# Patient Record
Sex: Male | Born: 1955 | Hispanic: No | Marital: Single | Smoking: Former smoker
Health system: Southern US, Community
[De-identification: ages and names within clinical notes are randomized; demographics above are authoritative.]

## PROBLEM LIST (undated history)

## (undated) DIAGNOSIS — G4700 Insomnia, unspecified: Secondary | ICD-10-CM

## (undated) DIAGNOSIS — E291 Testicular hypofunction: Secondary | ICD-10-CM

## (undated) DIAGNOSIS — N529 Male erectile dysfunction, unspecified: Secondary | ICD-10-CM

## (undated) DIAGNOSIS — K2270 Barrett's esophagus without dysplasia: Secondary | ICD-10-CM

## (undated) DIAGNOSIS — K029 Dental caries, unspecified: Secondary | ICD-10-CM

## (undated) DIAGNOSIS — B1920 Unspecified viral hepatitis C without hepatic coma: Secondary | ICD-10-CM

## (undated) DIAGNOSIS — E119 Type 2 diabetes mellitus without complications: Secondary | ICD-10-CM

## (undated) HISTORY — DX: Male erectile dysfunction, unspecified: N52.9

## (undated) HISTORY — DX: Testicular hypofunction: E29.1

## (undated) HISTORY — PX: WRIST SURGERY: SHX841

## (undated) HISTORY — DX: Insomnia, unspecified: G47.00

## (undated) HISTORY — DX: Dental caries, unspecified: K02.9

## (undated) HISTORY — DX: Barrett's esophagus without dysplasia: K22.70

## (undated) HISTORY — DX: Unspecified viral hepatitis C without hepatic coma: B19.20

## (undated) HISTORY — DX: Type 2 diabetes mellitus without complications: E11.9

---

## 2011-03-27 DIAGNOSIS — E109 Type 1 diabetes mellitus without complications: Secondary | ICD-10-CM | POA: Insufficient documentation

## 2012-05-04 DIAGNOSIS — K2270 Barrett's esophagus without dysplasia: Secondary | ICD-10-CM | POA: Insufficient documentation

## 2012-05-04 DIAGNOSIS — K222 Esophageal obstruction: Secondary | ICD-10-CM | POA: Insufficient documentation

## 2013-06-28 DIAGNOSIS — S63096A Other dislocation of unspecified wrist and hand, initial encounter: Secondary | ICD-10-CM | POA: Insufficient documentation

## 2013-07-30 DIAGNOSIS — M009 Pyogenic arthritis, unspecified: Secondary | ICD-10-CM | POA: Insufficient documentation

## 2014-03-31 ENCOUNTER — Ambulatory Visit: Payer: Self-pay | Admitting: Gastroenterology

## 2014-06-24 ENCOUNTER — Telehealth: Payer: Self-pay

## 2014-06-24 NOTE — Telephone Encounter (Signed)
Pt needs a letter from Dr. Servando SnareWohl stating he needs to take his Hep C medication daily without stopping. GF  > From: Samuella Cotallyn, Shaunna > To: Anasofia Micallef > Sent: 06/23/2014 10:17 AM > please call pt

## 2014-08-08 ENCOUNTER — Telehealth: Payer: Self-pay | Admitting: Gastroenterology

## 2014-08-08 NOTE — Telephone Encounter (Signed)
Pt needs to talk about his treatment

## 2014-08-08 NOTE — Telephone Encounter (Signed)
Pt has completed his HEP C treatment. Advised him he needs follow up appt. Pt scheduled with Dr. Servando SnareWohl on 08-22-14.

## 2014-08-19 ENCOUNTER — Other Ambulatory Visit: Payer: Self-pay

## 2014-08-22 ENCOUNTER — Other Ambulatory Visit: Payer: Self-pay

## 2014-08-22 ENCOUNTER — Encounter (INDEPENDENT_AMBULATORY_CARE_PROVIDER_SITE_OTHER): Payer: Self-pay

## 2014-08-22 ENCOUNTER — Ambulatory Visit (INDEPENDENT_AMBULATORY_CARE_PROVIDER_SITE_OTHER): Payer: BLUE CROSS/BLUE SHIELD | Admitting: Gastroenterology

## 2014-08-22 ENCOUNTER — Encounter: Payer: Self-pay | Admitting: Gastroenterology

## 2014-08-22 VITALS — BP 139/74 | HR 76 | Temp 98.2°F | Ht 78.0 in | Wt 239.0 lb

## 2014-08-22 DIAGNOSIS — E291 Testicular hypofunction: Secondary | ICD-10-CM | POA: Insufficient documentation

## 2014-08-22 DIAGNOSIS — B182 Chronic viral hepatitis C: Secondary | ICD-10-CM | POA: Diagnosis not present

## 2014-08-22 DIAGNOSIS — N529 Male erectile dysfunction, unspecified: Secondary | ICD-10-CM | POA: Insufficient documentation

## 2014-08-22 DIAGNOSIS — G4700 Insomnia, unspecified: Secondary | ICD-10-CM | POA: Insufficient documentation

## 2014-08-22 DIAGNOSIS — E1165 Type 2 diabetes mellitus with hyperglycemia: Secondary | ICD-10-CM | POA: Insufficient documentation

## 2014-08-22 DIAGNOSIS — K029 Dental caries, unspecified: Secondary | ICD-10-CM | POA: Insufficient documentation

## 2014-08-22 DIAGNOSIS — N400 Enlarged prostate without lower urinary tract symptoms: Secondary | ICD-10-CM | POA: Insufficient documentation

## 2014-08-22 DIAGNOSIS — IMO0002 Reserved for concepts with insufficient information to code with codable children: Secondary | ICD-10-CM | POA: Insufficient documentation

## 2014-08-22 NOTE — Progress Notes (Signed)
Primary Care Physician: Manfred Arch, MD  Primary Gastroenterologist:  Dr. Midge Minium  Chief Complaint  Patient presents with  . Hepatitis C    Treatment completed    HPI: Richard Molina is a 59 y.o. male here for follow-up of hepatitis C. The patient has a history of peptic stones with a history of surgery of the pancreas. The patient also was found to have hepatitis C and was recently treated. The patient's imaging showed the patient to have cirrhosis of the liver. The patient has been diabetic due to his pancreatic problems and takes enzymes for his pancreatic insufficiency.  Current Outpatient Prescriptions  Medication Sig Dispense Refill  . ANDROGEL PUMP 20.25 MG/ACT (1.62%) GEL APPLY FOUR PUMPS ONCE DAILY (IN THE MORNING)  5  . buPROPion (WELLBUTRIN SR) 150 MG 12 hr tablet Take by mouth.    . cyclobenzaprine (FLEXERIL) 10 MG tablet TAKE ONE TABLET THREE TIMES DAILY if needed  2  . diphenhydrAMINE (BENADRYL) 50 MG capsule Take 50 mg by mouth.    Marland Kitchen NOVOLOG 100 UNIT/ML injection USE AS DIRECTED VIA INSULIN PUMP (100 UNITS PER DAY)  11  . NOVOLOG FLEXPEN 100 UNIT/ML FlexPen Use up to 50 units/day, divided TID AC meals as per MD instructions  12  . ZENPEP 25000 UNITS CPEP TAKE ONE CAPSULE THREE TIMES DAILY  1  . zolpidem (AMBIEN) 10 MG tablet Take ONE (1) TABLET BY MOUTH AT BEDTIME AS NEEDED  1  . gabapentin (NEURONTIN) 100 MG capsule Take 100 mg by mouth.    . MULTIPLE VITAMINS-MINERALS ER PO Take by mouth.    . sildenafil (REVATIO) 20 MG tablet Take by mouth.     No current facility-administered medications for this visit.    Allergies as of 08/22/2014  . (No Known Allergies)    ROS:  General: Negative for anorexia, weight loss, fever, chills, fatigue, weakness. ENT: Negative for hoarseness, difficulty swallowing , nasal congestion. CV: Negative for chest pain, angina, palpitations, dyspnea on exertion, peripheral edema.  Respiratory: Negative for dyspnea at rest, dyspnea  on exertion, cough, sputum, wheezing.  GI: See history of present illness. GU:  Negative for dysuria, hematuria, urinary incontinence, urinary frequency, nocturnal urination.  Endo: Negative for unusual weight change.    Physical Examination:   BP 139/74 mmHg  Pulse 76  Temp(Src) 98.2 F (36.8 C)  Ht 6\' 6"  (1.981 m)  Wt 239 lb (108.41 kg)  BMI 27.62 kg/m2  General: Well-nourished, well-developed in no acute distress.  Eyes: No icterus. Conjunctivae pink. Mouth: Oropharyngeal mucosa moist and pink , no lesions erythema or exudate. Lungs: Clear to auscultation bilaterally. Non-labored. Heart: Regular rate and rhythm, no murmurs rubs or gallops.  Abdomen: Bowel sounds are normal, nontender, nondistended, no hepatosplenomegaly or masses, no abdominal bruits or hernia , no rebound or guarding.   Extremities: No lower extremity edema. No clubbing or deformities. Neuro: Alert and oriented x 3.  Grossly intact. Skin: Warm and dry, no jaundice.   Psych: Alert and cooperative, normal mood and affect.  Labs:    Imaging Studies: No results found.  Assessment and Plan:   Richard Molina is a 59 y.o. y/o male with a history of hepatitis C who was just recently completed treatment. The patient states that he had fatigue and headaches during the treatment. He now is also reporting that he has to stay very aware of his activity due to becoming hypoglycemic very easily due to his pancreatic problem. The patient will have his labs checked  to see if his viral load is negative. He'll also be checked in 6 months. He has been told with his history of cirrhosis he needs to be every 6 months for hepatocellular carcinoma screening. He has been explained the plan and agrees with it.

## 2014-10-26 DIAGNOSIS — M25519 Pain in unspecified shoulder: Secondary | ICD-10-CM | POA: Insufficient documentation

## 2015-04-26 DIAGNOSIS — K7400 Hepatic fibrosis, unspecified: Secondary | ICD-10-CM | POA: Insufficient documentation

## 2015-08-29 DIAGNOSIS — E139 Other specified diabetes mellitus without complications: Secondary | ICD-10-CM | POA: Insufficient documentation

## 2015-08-29 DIAGNOSIS — Z90410 Acquired total absence of pancreas: Secondary | ICD-10-CM

## 2015-08-29 DIAGNOSIS — E891 Postprocedural hypoinsulinemia: Secondary | ICD-10-CM

## 2016-08-28 ENCOUNTER — Telehealth: Payer: Self-pay | Admitting: Gastroenterology

## 2016-08-28 NOTE — Telephone Encounter (Signed)
I set an appt for 8/21 Hep C follow up with Dr. Servando SnareWohl. Patient wants to go ahead and have an ultrasound done now if he can due to insurance. Please call

## 2016-08-29 ENCOUNTER — Other Ambulatory Visit: Payer: Self-pay

## 2016-08-29 DIAGNOSIS — Z8619 Personal history of other infectious and parasitic diseases: Secondary | ICD-10-CM

## 2016-08-29 NOTE — Telephone Encounter (Signed)
Pt scheduled for a RUQ abdominal US [facility] [place] on Tuesday, August 21st at 1:00. Pt has been instructed to arrive at 12:45am and to be npo 6 hours prior. Pt verbalized understanding of these instructions.

## 2016-09-10 ENCOUNTER — Ambulatory Visit
Admission: RE | Admit: 2016-09-10 | Discharge: 2016-09-10 | Disposition: A | Discharge status: Home / Self Care | Payer: BC Managed Care – PPO | Source: Ambulatory Visit | Attending: Gastroenterology | Admitting: Gastroenterology

## 2016-09-10 ENCOUNTER — Ambulatory Visit (INDEPENDENT_AMBULATORY_CARE_PROVIDER_SITE_OTHER): Payer: BC Managed Care – PPO | Admitting: Gastroenterology

## 2016-09-10 ENCOUNTER — Other Ambulatory Visit: Payer: Self-pay

## 2016-09-10 ENCOUNTER — Encounter: Payer: Self-pay | Admitting: Gastroenterology

## 2016-09-10 VITALS — BP 147/67 | HR 76 | Temp 98.2°F | Ht 78.0 in | Wt 235.0 lb

## 2016-09-10 DIAGNOSIS — Z8619 Personal history of other infectious and parasitic diseases: Secondary | ICD-10-CM | POA: Diagnosis present

## 2016-09-10 DIAGNOSIS — K7460 Unspecified cirrhosis of liver: Secondary | ICD-10-CM

## 2016-09-10 DIAGNOSIS — B182 Chronic viral hepatitis C: Secondary | ICD-10-CM

## 2016-09-10 NOTE — Progress Notes (Signed)
Primary Care Physician: Manfred Arch, MD  Primary Gastroenterologist:  Dr. Midge Minium  Chief Complaint  Patient presents with  . Follow up Hepatitis C    HPI: Richard Molina is a 61 y.o. male here for follow-up visit for his cirrhosis and hepatitis C.  The patient has been treated for his hepatitis C and has sustained viral response. The patient now comes in for follow-up of his cirrhosis.  The patient was followed at [facility] but his gastrologist there left.  Current Outpatient Prescriptions  Medication Sig Dispense Refill  . doxepin (SINEQUAN) 10 MG/ML solution TAKE 0.67ml-0.6ml BY MOUTH EVERY NIGHT AT BEDTIME  99  . FETZIMA 80 MG CP24 TAKE ONE (1) CAPSULE BY MOUTH ONCE DAILY  99  . insulin glargine (LANTUS) 100 UNIT/ML injection 30 unit subcutaneously at bedtime    . Insulin Pen Needle (BD PEN NEEDLE NANO U/F) 32G X 4 MM MISC as directed    . NOVOLOG 100 UNIT/ML injection USE AS DIRECTED VIA INSULIN PUMP (100 UNITS PER DAY)  11  . NOVOLOG FLEXPEN 100 UNIT/ML FlexPen Use up to 50 units/day, divided TID AC meals as per MD instructions  12  . testosterone cypionate (DEPOTESTOSTERONE CYPIONATE) 200 MG/ML injection Inject ONE (1) milliliter into the muscle every TWO weeks  2  . ZENPEP 25000 UNITS CPEP TAKE ONE CAPSULE THREE TIMES DAILY  1  . zolpidem (AMBIEN) 10 MG tablet Take ONE (1) TABLET BY MOUTH AT BEDTIME AS NEEDED  1  . ANDROGEL PUMP 20.25 MG/ACT (1.62%) GEL APPLY FOUR PUMPS ONCE DAILY (IN THE MORNING)  5  . buPROPion (WELLBUTRIN SR) 150 MG 12 hr tablet Take by mouth.    . cyclobenzaprine (FLEXERIL) 10 MG tablet TAKE ONE TABLET THREE TIMES DAILY if needed  2  . diphenhydrAMINE (BENADRYL) 50 MG capsule Take 50 mg by mouth.    . gabapentin (NEURONTIN) 100 MG capsule Take 100 mg by mouth.    . MULTIPLE VITAMINS-MINERALS ER PO Take by mouth.    . sildenafil (REVATIO) 20 MG tablet Take by mouth.     No current facility-administered medications for this visit.     Allergies as of  09/10/2016  . (No Known Allergies)    ROS:  General: Negative for anorexia, weight loss, fever, chills, fatigue, weakness. ENT: Negative for hoarseness, difficulty swallowing , nasal congestion. CV: Negative for chest pain, angina, palpitations, dyspnea on exertion, peripheral edema.  Respiratory: Negative for dyspnea at rest, dyspnea on exertion, cough, sputum, wheezing.  GI: See history of present illness. GU:  Negative for dysuria, hematuria, urinary incontinence, urinary frequency, nocturnal urination.  Endo: Negative for unusual weight change.    Physical Examination:   BP (!) 147/67   Pulse 76   Temp 98.2 F (36.8 C) (Oral)   Ht 6\' 6"  (1.981 m)   Wt 235 lb (106.6 kg)   BMI 27.16 kg/m   General: Well-nourished, well-developed in no acute distress.  Eyes: No icterus. Conjunctivae pink. Mouth: Oropharyngeal mucosa moist and pink , no lesions erythema or exudate. Lungs: Clear to auscultation bilaterally. Non-labored. Heart: Regular rate and rhythm, no murmurs rubs or gallops.  Abdomen: Bowel sounds are normal, nontender, nondistended, no hepatosplenomegaly or masses, no abdominal bruits or hernia , no rebound or guarding.   Extremities: No lower extremity edema. No clubbing or deformities. Neuro: Alert and oriented x 3.  Grossly intact. Skin: Warm and dry, no jaundice.   Psych: Alert and cooperative, normal mood and affect.  Labs:  Imaging Studies: US Abdomen Limited Ruq  Result Date: 09/10/2016 CLINICAL DATA:  History of hepatitis-C. EXAM: ULTRASOUND ABDOMEN LIMITED RIGHT UPPER QUADRANT COMPARISON:  None. FINDINGS: Gallbladder: No gallstones or wall thickening visualized. No sonographic Murphy sign noted by sonographer. Common bile duct: Diameter: 4.5 mm Liver: No focal mass identified. Portal vein is patent on color Doppler imaging with normal direction of blood flow towards the liver. IMPRESSION: 1. No focal liver mass.  CT and MRI are both more sensitive. 2. No other  abnormalities. Electronically Signed   By: Gerome Sam III M.D   On: 09/10/2016 14:26    Assessment and Plan:   Richard Molina is a 61 y.o. y/o male Who has a history of hepatitis C causing cirrhosis.  The patient has had sustained viral response after treatment for his hepatitis C.  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  The patient will be set up for a alpha-fetoprotein blood test.  The patient had an ultrasound this morning.  The patient will be informed of the results and will follow up in 6 months.    Midge Minium, MD. Clementeen Graham   Note: This dictation was prepared with Dragon dictation along with smaller phrase technology. Any transcriptional errors that result from this process are unintentional.

## 2016-09-16 ENCOUNTER — Telehealth: Payer: Self-pay

## 2016-09-16 NOTE — Telephone Encounter (Signed)
-----   Message from Midge Minium, MD sent at 09/10/2016  6:20 PM EDT ----- The patient know his ultrasound did not show any masses

## 2016-09-16 NOTE — Telephone Encounter (Signed)
Pt notified of [place] results. Asked about the AFP lab test. Pt will go and have this done soon.

## 2017-09-09 ENCOUNTER — Other Ambulatory Visit: Payer: Self-pay

## 2017-09-12 ENCOUNTER — Other Ambulatory Visit: Payer: Self-pay

## 2017-09-12 DIAGNOSIS — B182 Chronic viral hepatitis C: Secondary | ICD-10-CM

## 2017-09-17 ENCOUNTER — Ambulatory Visit
Admission: RE | Admit: 2017-09-17 | Discharge: 2017-09-17 | Disposition: A | Discharge status: Home / Self Care | Payer: BC Managed Care – PPO | Source: Ambulatory Visit | Attending: Gastroenterology | Admitting: Gastroenterology

## 2017-09-17 ENCOUNTER — Encounter (INDEPENDENT_AMBULATORY_CARE_PROVIDER_SITE_OTHER): Payer: Self-pay

## 2017-09-17 DIAGNOSIS — B182 Chronic viral hepatitis C: Secondary | ICD-10-CM

## 2017-09-23 ENCOUNTER — Ambulatory Visit: Payer: BC Managed Care – PPO | Admitting: Gastroenterology

## 2017-09-24 ENCOUNTER — Ambulatory Visit: Payer: Self-pay | Admitting: Gastroenterology

## 2017-09-24 ENCOUNTER — Encounter: Payer: Self-pay | Admitting: Gastroenterology

## 2017-09-24 ENCOUNTER — Other Ambulatory Visit: Payer: Self-pay

## 2017-09-24 VITALS — BP 140/83 | HR 65 | Ht 78.0 in | Wt 233.0 lb

## 2017-09-24 DIAGNOSIS — Z8619 Personal history of other infectious and parasitic diseases: Secondary | ICD-10-CM

## 2017-09-24 DIAGNOSIS — K219 Gastro-esophageal reflux disease without esophagitis: Secondary | ICD-10-CM

## 2017-09-24 DIAGNOSIS — R1310 Dysphagia, unspecified: Secondary | ICD-10-CM

## 2017-09-24 NOTE — Progress Notes (Signed)
Primary Care Physician: Manfred Arch, MD  Primary Gastroenterologist:  Dr. Midge Minium  Chief Complaint  Patient presents with  . Follow up Cirrhosis    HPI: Richard Molina is a 62 y.o. male here for follow-up of his hepatitis C and history of cirrhosis.  The patient has had multiple ultrasounds and alpha-fetoprotein in the past due to his history of cirrhosis.  The patient was treated for his hepatitis C in 2016 and has had a negative viral load with normal liver enzymes since then.  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  All of his ultrasounds have not shown any signs of portal hypertension or nodularity of the liver.  The patient has been doing well except that he states that he is now having some trouble swallowing.  He reports that he has had his esophagus stretched in the past when he has had this complaint before.  Current Outpatient Medications  Medication Sig Dispense Refill  . albuterol (VENTOLIN HFA) 108 (90 Base) MCG/ACT inhaler Inhale into the lungs.    . ANDROGEL PUMP 20.25 MG/ACT (1.62%) GEL APPLY FOUR PUMPS ONCE DAILY (IN THE MORNING)  5  . atorvastatin (LIPITOR) 10 MG tablet atorvastatin 10 mg tablet    . buPROPion (WELLBUTRIN SR) 150 MG 12 hr tablet Take by mouth.    . cyclobenzaprine (FLEXERIL) 10 MG tablet TAKE ONE TABLET THREE TIMES DAILY if needed  2  . diphenhydrAMINE (BENADRYL) 50 MG capsule Take 50 mg by mouth.    . doxepin (SINEQUAN) 10 MG/ML solution TAKE 0.59ml-0.6ml BY MOUTH EVERY NIGHT AT BEDTIME  99  . FETZIMA 80 MG CP24 TAKE ONE (1) CAPSULE BY MOUTH ONCE DAILY  99  . gabapentin (NEURONTIN) 100 MG capsule Take 100 mg by mouth.    Marland Kitchen ibuprofen (ADVIL,MOTRIN) 200 MG tablet Take by mouth.    . insulin glargine (LANTUS) 100 UNIT/ML injection 30 unit subcutaneously at bedtime    . Insulin Pen Needle (BD PEN NEEDLE NANO U/F) 32G X 4 MM MISC as directed    . MULTIPLE  VITAMINS-MINERALS ER PO Take by mouth.    Marland Kitchen NOVOLOG 100 UNIT/ML injection USE AS DIRECTED VIA INSULIN PUMP (100 UNITS PER DAY)  11  . NOVOLOG FLEXPEN 100 UNIT/ML FlexPen Use up to 50 units/day, divided TID AC meals as per MD instructions  12  . ondansetron (ZOFRAN) 8 MG tablet ondansetron HCl 8 mg tablet  TAKE ONE TABLET BY MOUTH EVERY 8 HOURS for TWO days    . oxyCODONE (ROXICODONE) 15 MG immediate release tablet oxycodone 15 mg tablet    . Oxycodone HCl 10 MG TABS oxycodone 10 mg tablet    . sildenafil (REVATIO) 20 MG tablet Take by mouth.    . tamsulosin (FLOMAX) 0.4 MG CAPS capsule Take 0.4 mg by mouth daily.  0  . testosterone cypionate (DEPOTESTOSTERONE CYPIONATE) 200 MG/ML injection Inject ONE (1) milliliter into the muscle every TWO weeks  2  . ZENPEP 25000 UNITS CPEP TAKE ONE CAPSULE THREE TIMES DAILY  1  . zolpidem (AMBIEN) 10 MG tablet Take ONE (1) TABLET BY MOUTH AT BEDTIME AS NEEDED  1   No current facility-administered medications for this visit.     Allergies as of 09/24/2017  . (No Known Allergies)    ROS:  General: Negative for anorexia, weight loss, fever, chills, fatigue, weakness. ENT: Negative for hoarseness, difficulty swallowing , nasal congestion. CV:  Negative for chest pain, angina, palpitations, dyspnea on exertion, peripheral edema.  Respiratory: Negative for dyspnea at rest, dyspnea on exertion, cough, sputum, wheezing.  GI: See history of present illness. GU:  Negative for dysuria, hematuria, urinary incontinence, urinary frequency, nocturnal urination.  Endo: Negative for unusual weight change.    Physical Examination:   BP 140/83   Pulse 65   Ht 6\' 6"  (1.981 m)   Wt 233 lb (105.7 kg)   BMI 26.93 kg/m   General: Well-nourished, well-developed in no acute distress.  Eyes: No icterus. Conjunctivae pink. Mouth: Oropharyngeal mucosa moist and pink , no lesions erythema or exudate. Lungs: Clear to auscultation bilaterally. Non-labored. Heart:  Regular rate and rhythm, no murmurs rubs or gallops.  Abdomen: Bowel sounds are normal, nontender, nondistended, no hepatosplenomegaly or masses, no abdominal bruits or hernia , no rebound or guarding.   Extremities: No lower extremity edema. No clubbing or deformities. Neuro: Alert and oriented x 3.  Grossly intact. Skin: Warm and dry, no jaundice.   Psych: Alert and cooperative, normal mood and affect.  Labs:    Imaging Studies: US Abdomen Limited Ruq  Result Date: 09/17/2017 CLINICAL DATA:  62 year old male with a history of prior Whipple and hepatitis C EXAM: ULTRASOUND ABDOMEN LIMITED RIGHT UPPER QUADRANT COMPARISON:  CT 06/25/2013, ultrasound 09/10/2016 FINDINGS: Gallbladder: No gallstones or wall thickening visualized. No sonographic Murphy sign noted by sonographer. Common bile duct: Diameter: 5 mm Liver: Coarsened echogenicity of liver parenchyma without significant nodularity of the liver surface. No focal lesion identified. Portal vein is patent on color Doppler imaging with normal direction of blood flow towards the liver. IMPRESSION: Hepatic ultrasound survey is negative for focal lesion, with relatively increased echogenicity potentially representing chronic medical liver disease. Electronically Signed   By: Gilmer Mor D.O.   On: 09/17/2017 15:22    Assessment and Plan:   Richard Molina is a 62 y.o. y/o male who has a history of esophageal strictures and hepatitis C who was treated for his hepatitis C and came to be with a pre-existing diagnosis of cirrhosis.  The patient has not had any signs of cirrhosis on his lab work or his ultrasound in the past.  The patient will be set up for a fibrosis scan to see if there is any sign of cirrhosis.  If there is no cirrhosis then the cell carcinoma surveillance will not need to be continued.  The patient will be set up for an EGD because of his dysphasia.  He will also have his alpha-fetoprotein sent off until we are sure that he does not have  cirrhosis.  The patient has been explained the plan and agrees with it.    Midge Minium, MD. Clementeen Graham   Note: This dictation was prepared with Dragon dictation along with smaller phrase technology. Any transcriptional errors that result from this process are unintentional.

## 2017-10-23 ENCOUNTER — Ambulatory Visit: Payer: BC Managed Care – PPO | Admitting: Gastroenterology

## 2017-10-23 ENCOUNTER — Encounter

## 2018-01-10 LAB — AFP TUMOR MARKER: AFP, Serum, Tumor Marker: 2.4 ng/mL (ref 0.0–8.3)

## 2018-01-16 ENCOUNTER — Telehealth: Payer: Self-pay

## 2018-01-16 NOTE — Telephone Encounter (Signed)
-----   Message from Midge Miniumarren Wohl, MD sent at 01/10/2018  7:04 AM EST ----- Let the patient know the tumor marker was negative.

## 2018-01-16 NOTE — Telephone Encounter (Signed)
Pt notified of results

## 2019-02-11 IMAGING — US US ABDOMEN LIMITED
1 series · 14 of 25 positions shown · non-contrast
Comparison: None.

CLINICAL DATA: History of hepatitis-C.

EXAM:
ULTRASOUND ABDOMEN LIMITED RIGHT UPPER QUADRANT

[Series 1: us abdomen limited · 0.26mm/px · 14 of 49 slices shown]
[im 1/49]
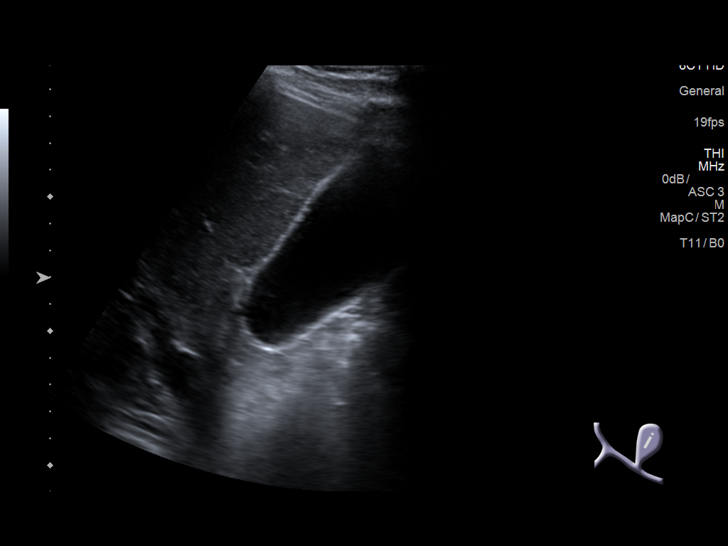
[im 5/49]
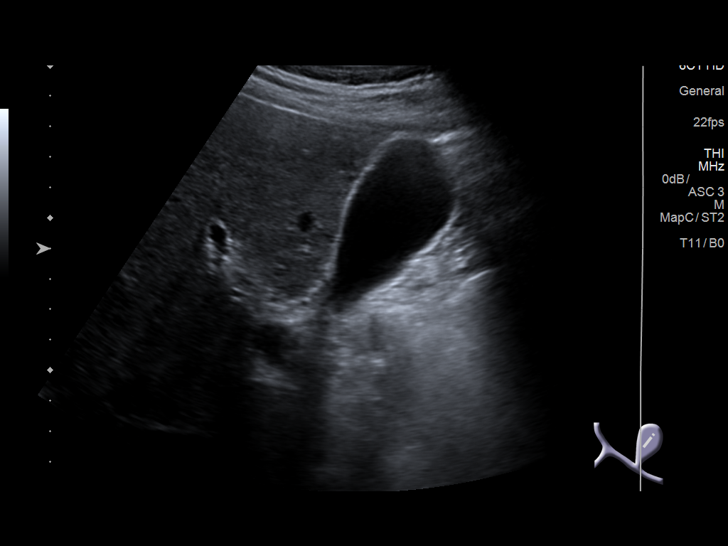
[im 9/49]
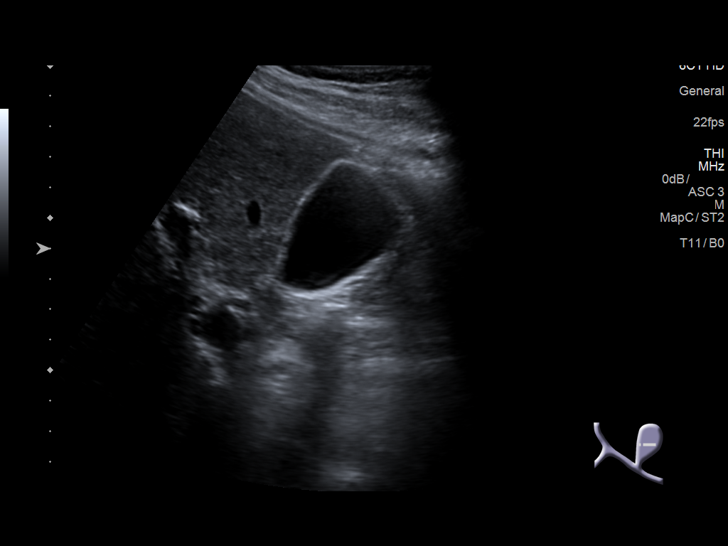
[im 13/49]
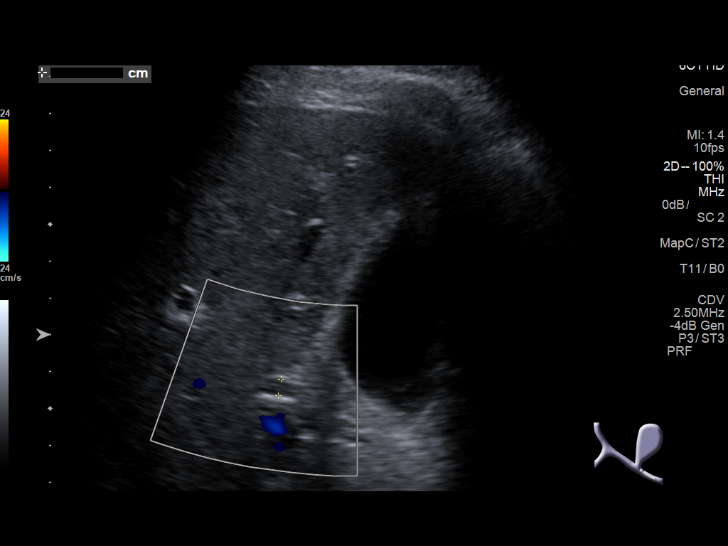
[im 17/49]
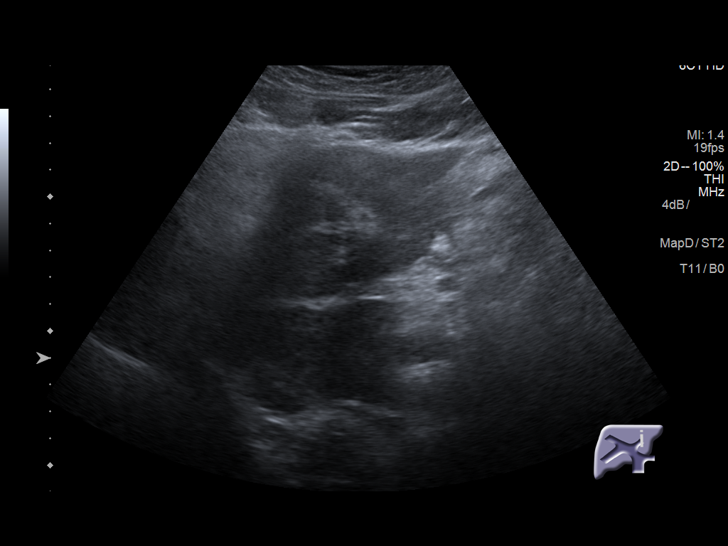
[im 19/49]
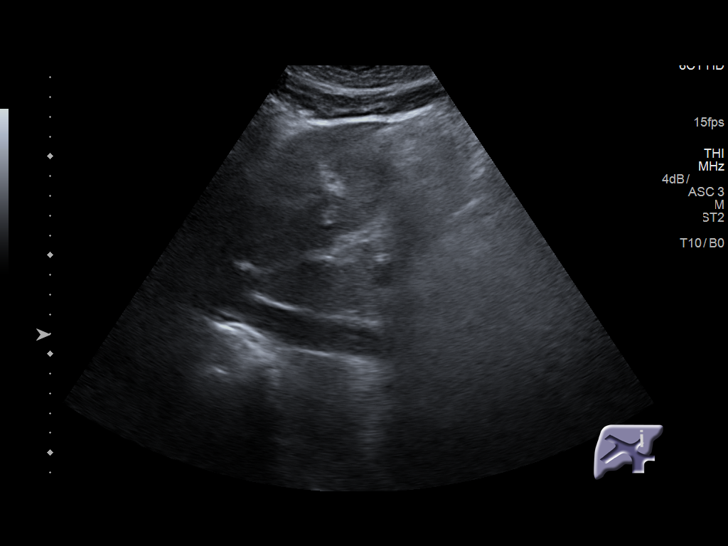
[im 23/49]
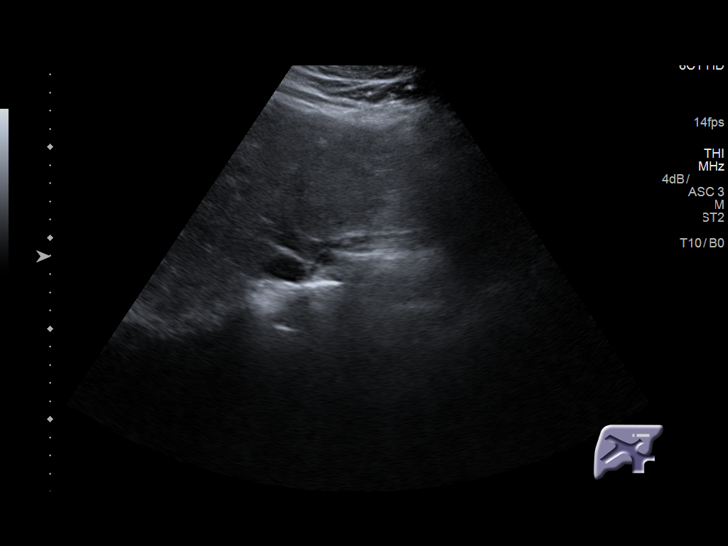
[im 27/49]
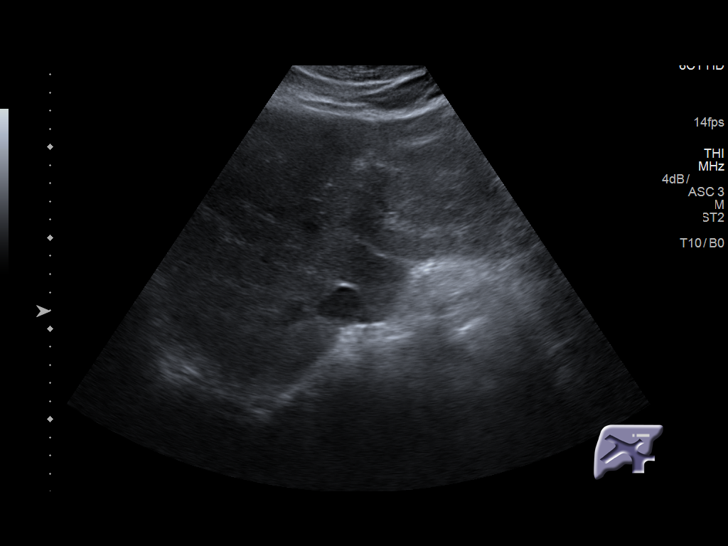
[im 31/49]
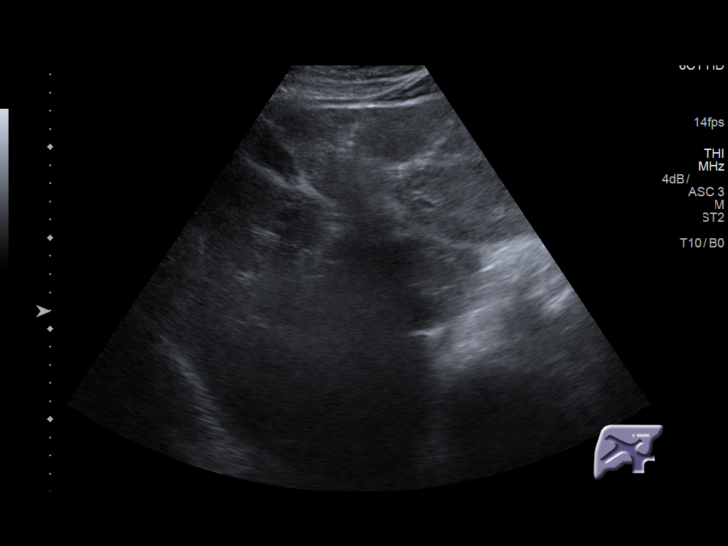
[im 33/49]
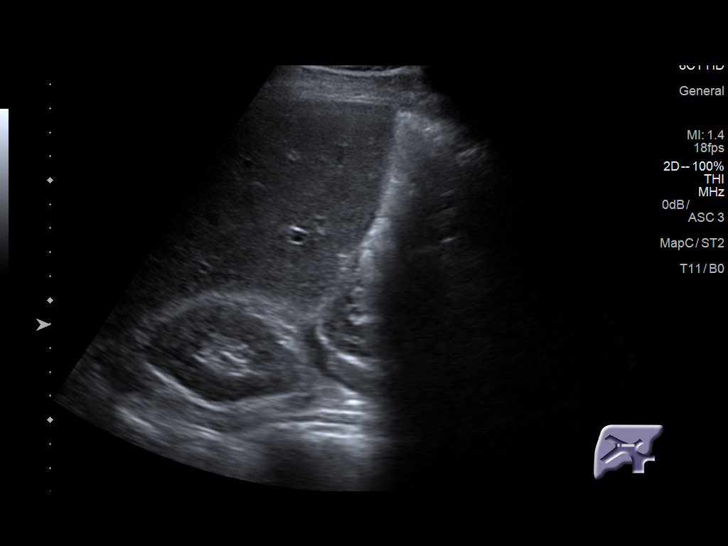
[im 37/49]
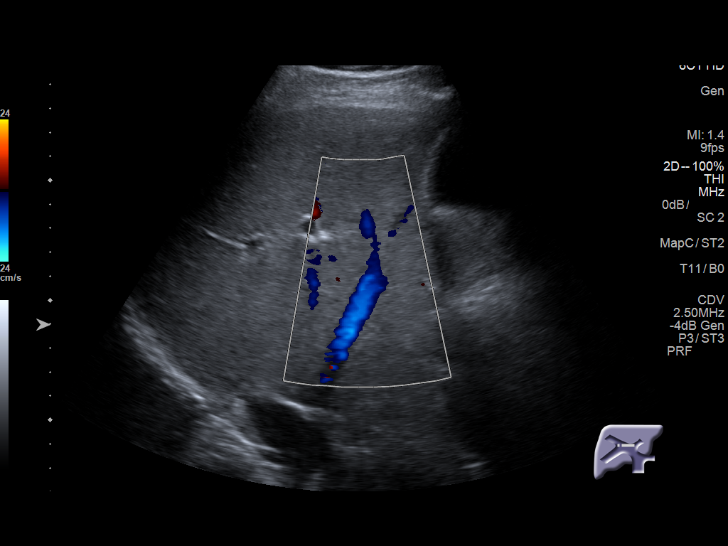
[im 41/49]
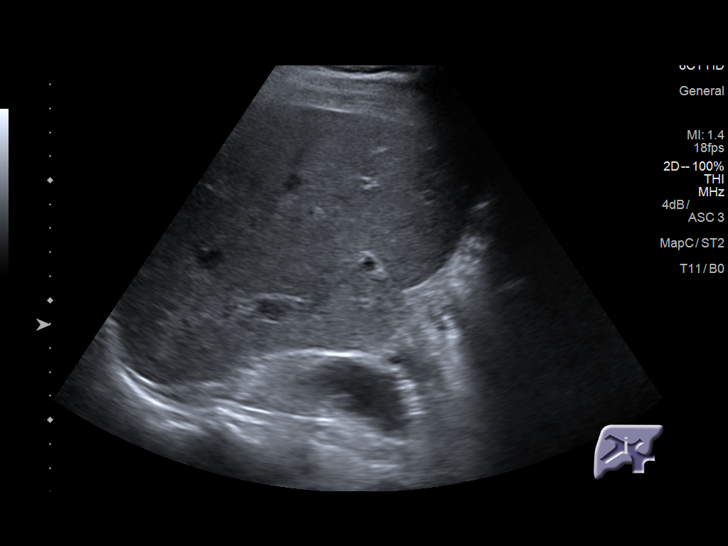
[im 45/49]
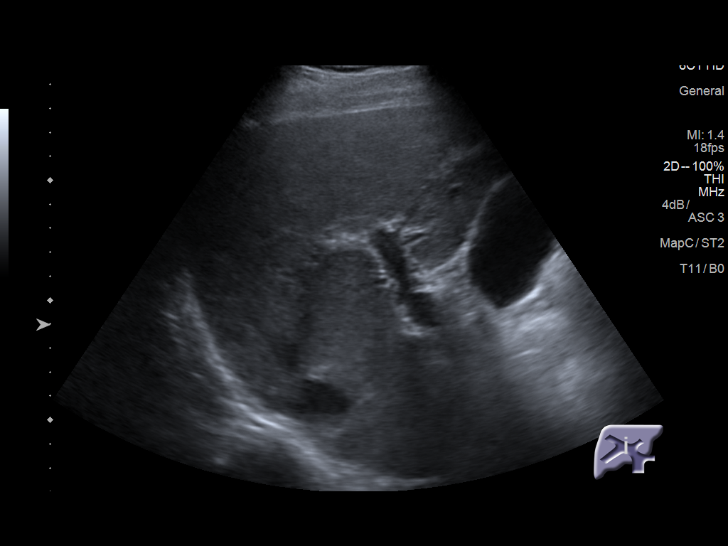
[im 49/49]
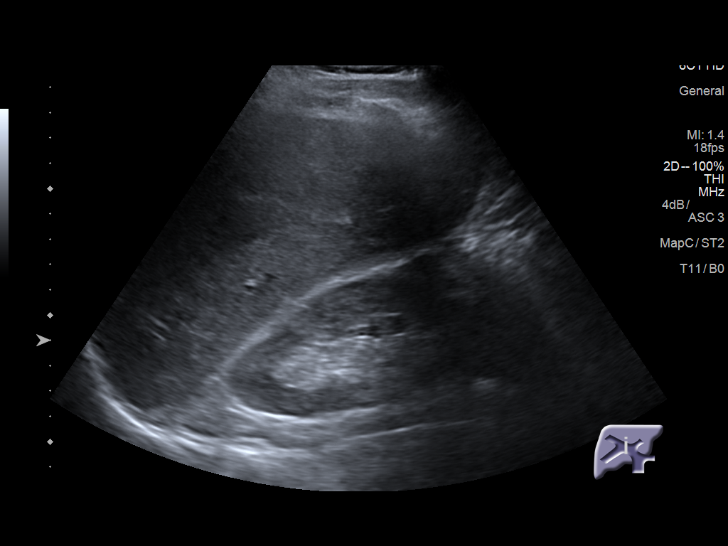

[14 of 25 positions shown; findings below may reference images not displayed]

FINDINGS: Gallbladder:

No gallstones or wall thickening visualized. No sonographic Murphy
sign noted by sonographer.

Common bile duct:

Diameter: 4.5 mm

Liver:

No focal mass identified. Portal vein is patent on color Doppler
imaging with normal direction of blood flow towards the liver.
IMPRESSION: 1. No focal liver mass.  CT and MRI are both more sensitive.
2. No other abnormalities.

## 2020-02-18 IMAGING — US US ABDOMEN LIMITED
1 series · 14 of 25 positions shown · non-contrast
Comparison: CT 06/25/2013, ultrasound 09/10/2016

CLINICAL DATA: 61-year-old male with a history of prior Whipple and
hepatitis C

EXAM:
ULTRASOUND ABDOMEN LIMITED RIGHT UPPER QUADRANT

[Series 1: us abdomen limited · 0.28mm/px · 14 of 48 slices shown]
[im 1/48]
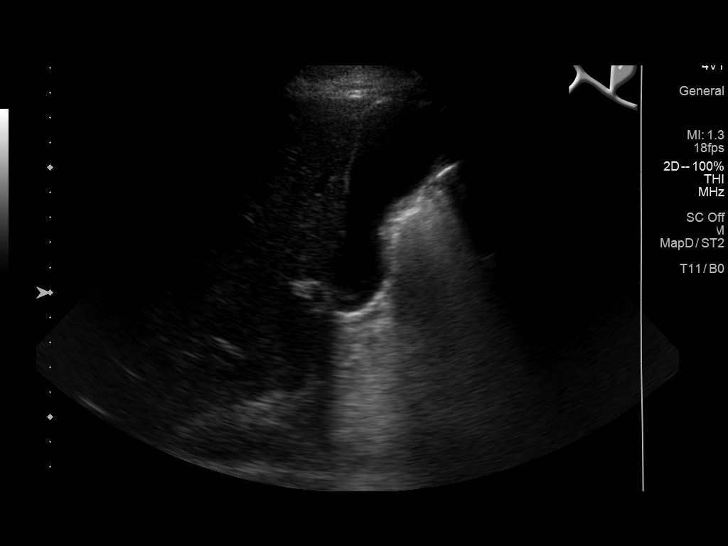
[im 4/48]
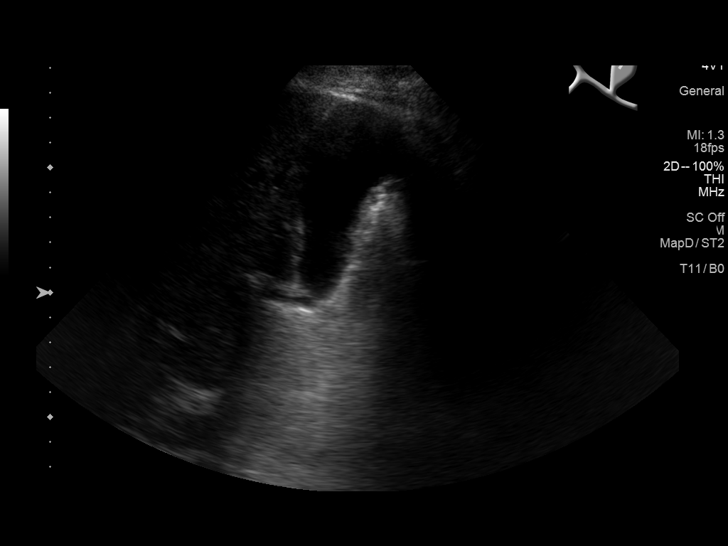
[im 8/48]
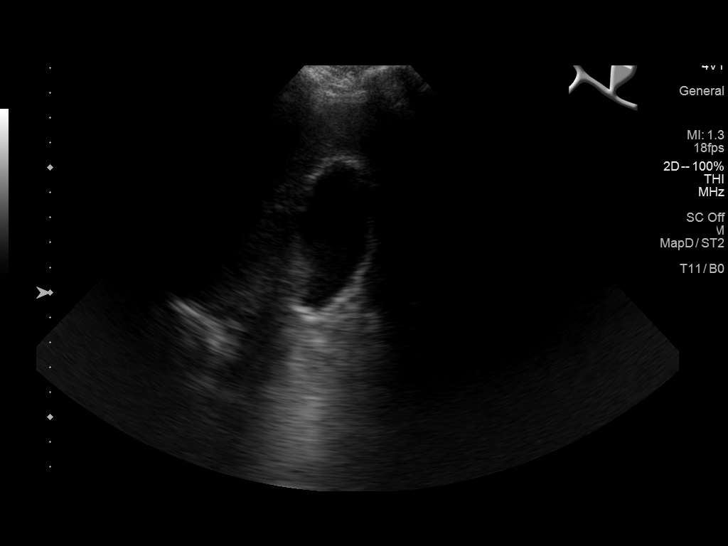
[im 12/48]
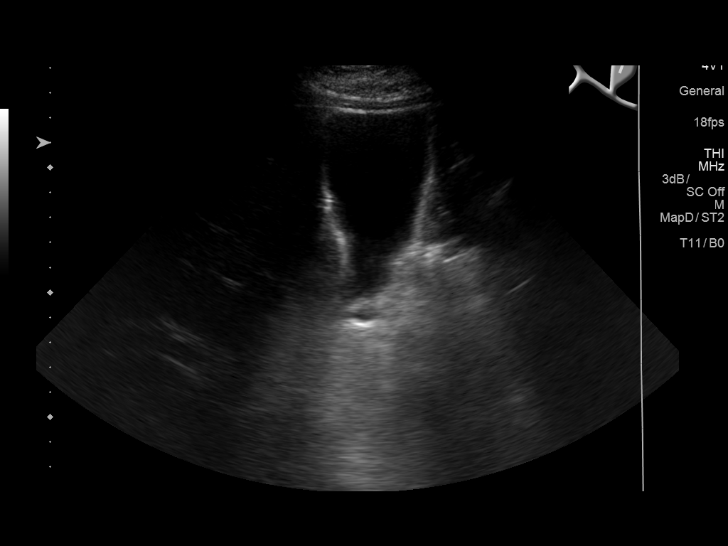
[im 16/48]
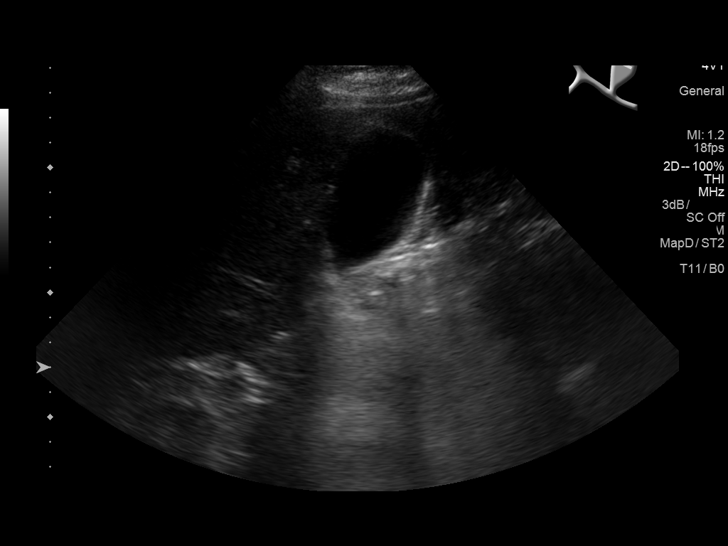
[im 18/48]
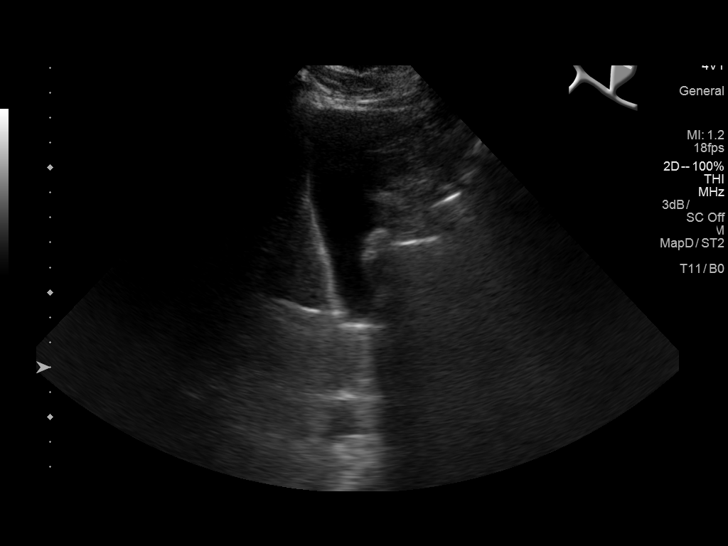
[im 22/48]
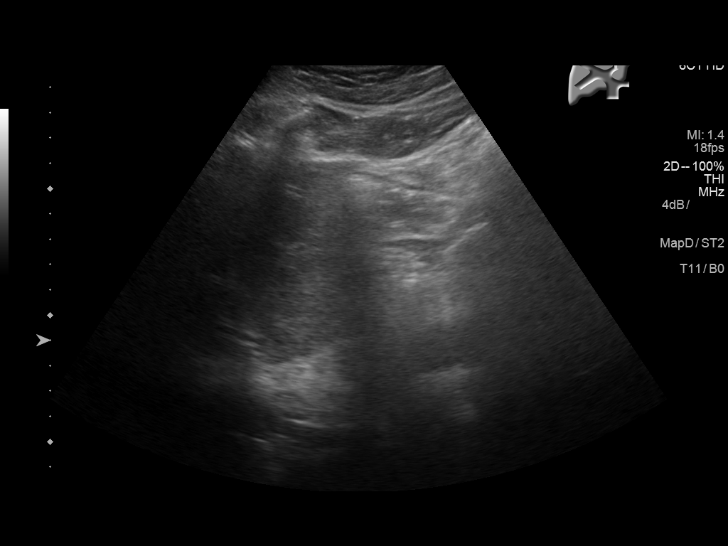
[im 26/48]
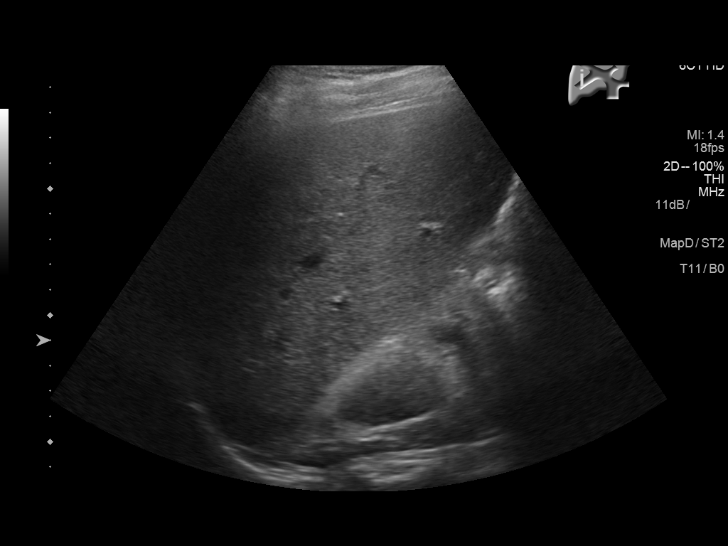
[im 30/48]
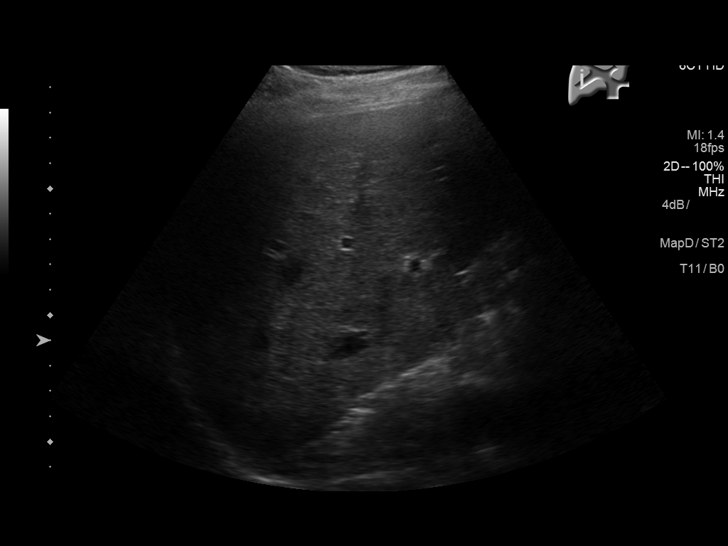
[im 32/48]
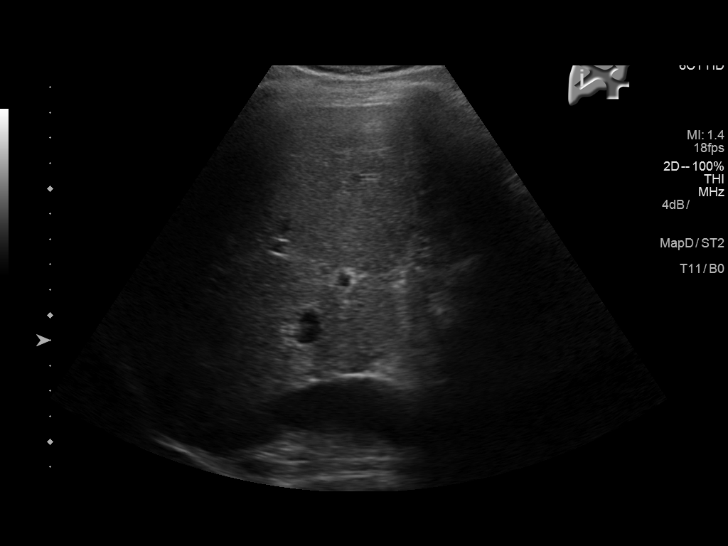
[im 36/48]
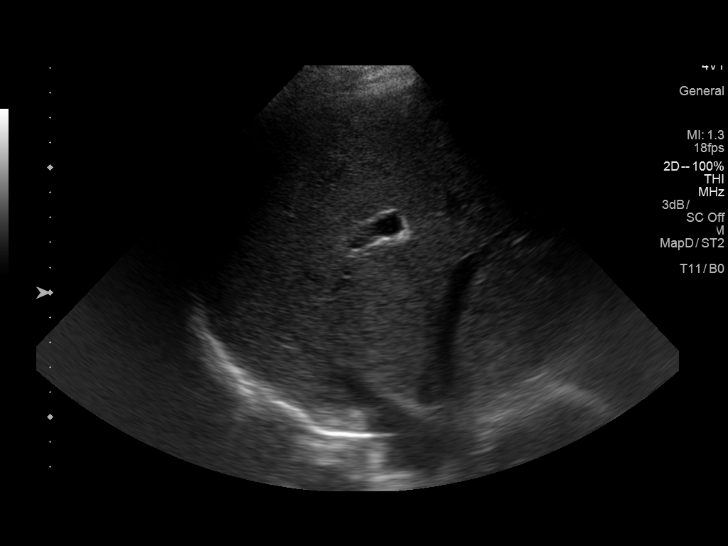
[im 40/48]
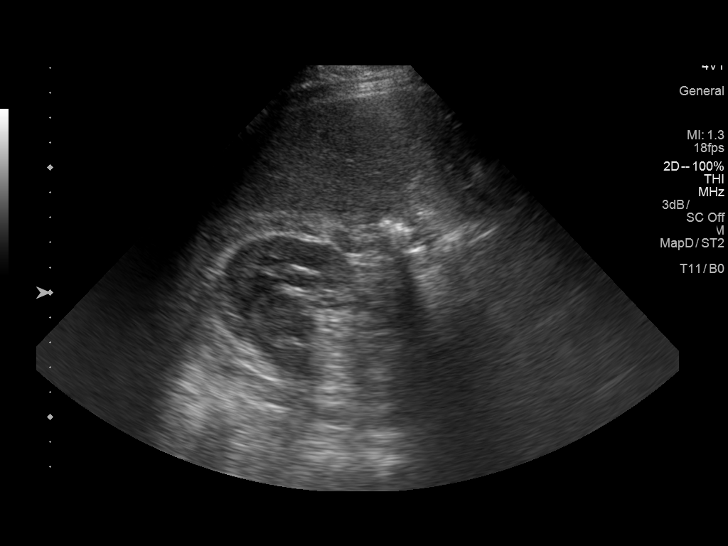
[im 44/48]
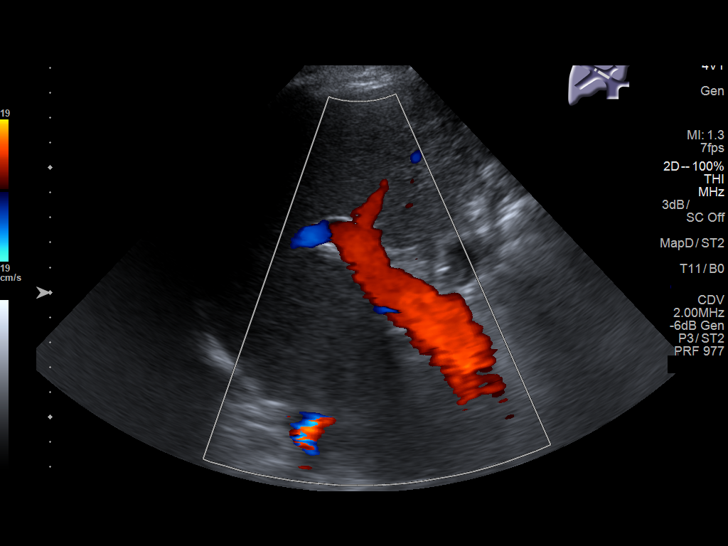
[im 48/48]
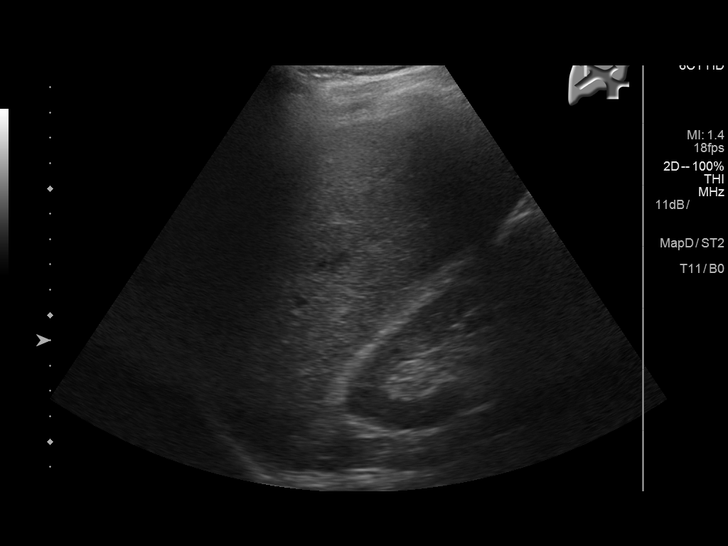

[14 of 25 positions shown; findings below may reference images not displayed]

FINDINGS: Gallbladder:

No gallstones or wall thickening visualized. No sonographic Murphy
sign noted by sonographer.

Common bile duct:

Diameter: 5 mm

Liver:

Coarsened echogenicity of liver parenchyma without significant
nodularity of the liver surface. No focal lesion identified. Portal
vein is patent on color Doppler imaging with normal direction of
blood flow towards the liver.
IMPRESSION: Hepatic ultrasound survey is negative for focal lesion, with
relatively increased echogenicity potentially representing chronic
medical liver disease.
